# Patient Record
Sex: Male | Born: 1985 | Race: Asian | Hispanic: No | Marital: Single | Smoking: Former smoker
Health system: Southern US, Community
[De-identification: ages and names within clinical notes are randomized; demographics above are authoritative.]

---

## 2013-02-28 ENCOUNTER — Ambulatory Visit (INDEPENDENT_AMBULATORY_CARE_PROVIDER_SITE_OTHER): Payer: 59 | Admitting: Family Medicine

## 2013-02-28 VITALS — BP 120/80 | HR 73 | Temp 98.4°F | Resp 16 | Ht 71.5 in | Wt 179.0 lb

## 2013-02-28 DIAGNOSIS — N509 Disorder of male genital organs, unspecified: Secondary | ICD-10-CM

## 2013-02-28 DIAGNOSIS — R259 Unspecified abnormal involuntary movements: Secondary | ICD-10-CM

## 2013-02-28 DIAGNOSIS — N50811 Right testicular pain: Secondary | ICD-10-CM

## 2013-02-28 DIAGNOSIS — Z8739 Personal history of other diseases of the musculoskeletal system and connective tissue: Secondary | ICD-10-CM

## 2013-02-28 DIAGNOSIS — G252 Other specified forms of tremor: Secondary | ICD-10-CM

## 2013-02-28 DIAGNOSIS — Z0000 Encounter for general adult medical examination without abnormal findings: Secondary | ICD-10-CM

## 2013-02-28 LAB — POCT CBC
Granulocyte percent: 67.2 %G (ref 37–80)
HEMATOCRIT: 50.5 % (ref 43.5–53.7)
Hemoglobin: 16.4 g/dL (ref 14.1–18.1)
Lymph, poc: 2.1 (ref 0.6–3.4)
MCH, POC: 30.3 pg (ref 27–31.2)
MCHC: 32.5 g/dL (ref 31.8–35.4)
MCV: 93.2 fL (ref 80–97)
MID (cbc): 0.3 (ref 0–0.9)
MPV: 9.2 fL (ref 0–99.8)
POC Granulocyte: 4.9 (ref 2–6.9)
POC LYMPH PERCENT: 28.1 %L (ref 10–50)
POC MID %: 4.7 %M (ref 0–12)
Platelet Count, POC: 352 10*3/uL (ref 142–424)
RBC: 5.42 M/uL (ref 4.69–6.13)
RDW, POC: 13.3 %
WBC: 7.3 10*3/uL (ref 4.6–10.2)

## 2013-02-28 LAB — POCT UA - MICROSCOPIC ONLY
BACTERIA, U MICROSCOPIC: NEGATIVE
CASTS, UR, LPF, POC: NEGATIVE
Crystals, Ur, HPF, POC: NEGATIVE
Epithelial cells, urine per micros: NEGATIVE
MUCUS UA: NEGATIVE
RBC, urine, microscopic: NEGATIVE
WBC, Ur, HPF, POC: NEGATIVE
Yeast, UA: NEGATIVE

## 2013-02-28 LAB — POCT URINALYSIS DIPSTICK
Bilirubin, UA: NEGATIVE
Blood, UA: NEGATIVE
GLUCOSE UA: NEGATIVE
Ketones, UA: NEGATIVE
Leukocytes, UA: NEGATIVE
Nitrite, UA: NEGATIVE
Protein, UA: NEGATIVE
SPEC GRAV UA: 1.015
UROBILINOGEN UA: 0.2
pH, UA: 7.5

## 2013-02-28 NOTE — Patient Instructions (Signed)
I will let you know the results of the rest of your labs, including a thyroid test, in about one week.  Our referrals desk will contact you with regard to the referral to a urologist to check your testicle. If you do not hear from us within the next 10 days call back and speak to referrals to find out what is going on.  Return as needed

## 2013-02-28 NOTE — Progress Notes (Signed)
Annual physical examination   History: 28 year old male who is here for a physical examination. He has generally been in good health. Several years ago in [place] he had a painful scrotum and they told him there was a bubble in his sac,which was seen on the ultrasound. He was told this was not of major concern. He did have surgery on it but wouldn't be able to father children. He also had testing done about for 5 years ago when he was hardly able to move and told he was positive for ankylosing spondylitis. Otherwise been healthy young man. Recently has been having pain in his right side of the right testicle and is worried that he could have cancer or something. He also says that he has a fine tremor sometimes in his hands after playing video games for an hour. His father had hyperthyroidism tremor, and he is concerned about the possibility of that.  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 He is single. Has been in the [place] for 3 years afterstudying English 6 years in [place].he does work out some regularly.  Review of systems: Constitutional: Unremarkable Respiratory: Unremarkable Progress her: Unremarkable HEENT: Unremarkable GI: Used to have some heartburn but it's not a problem now GU: Testicular pain as above The skeletal: Unremarkable Dermatologic: Unremarkable Neurologic: Mild tremor as discussed above Psychiatric: Unremarkable  Physical examination: Well-developed well-nourished young man in no major distress. TMs normal. Eyes PERRLA. Fundi benign. Throat clear. Neck supple without nodes or thyromegaly. No  carotid bruits. Chest clear to auscultation. Heart regular without murmurs gallops or arrhythmias. Abdomen soft without mass or tenderness. Normal male external genitalia, circumcised, no testicular masses could be appreciated is a little tender on the lateral aspect of the right scrotum right testicle. Extremities unremarkable. Skin unremarkable. Neurological is essentially normal except for very minimal tremor only seen in a piece of paper is placed across his outstretched hand.  Assessment:  Annual physical exam Mild resting tremor Testicular pain History of ankylosing spondylitis  Plan: Check basic labs and chemistries. For him to a urologist for evaluation of the testicle. He probably needs an ultrasound done for peace of mind. I will let a urologist decide  . Results for orders placed in visit on 02/28/13  POCT CBC      Result Value Range   WBC 7.3  4.6 - 10.2 K/uL   Lymph, poc 2.1  0.6 - 3.4   POC LYMPH PERCENT 28.1  10 - 50 %L   MID (cbc) 0.3  0 - 0.9   POC MID % 4.7  0 - 12 %M   POC Granulocyte 4.9  2 - 6.9   Granulocyte percent 67.2  37 - 80 %G   RBC 5.42  4.69 - 6.13 M/uL   Hemoglobin 16.4  14.1 - 18.1 g/dL   HCT, POC 16.1  09.6 - 53.7 %   MCV 93.2  80 - 97 fL   MCH, POC 30.3  27 - 31.2 pg   MCHC 32.5  31.8 - 35.4 g/dL   RDW, POC 04.5     Platelet Count, POC 352  142 - 424 K/uL   MPV 9.2  0 - 99.8 fL  POCT UA - MICROSCOPIC ONLY      Result Value Range   WBC, Ur, HPF, POC neg     RBC, urine, microscopic neg     Bacteria, U Microscopic neg     Mucus, UA neg     Epithelial cells, urine per micros neg     Crystals, Ur, HPF, POC neg     Casts, Ur, LPF, POC neg     Yeast, UA neg    POCT URINALYSIS DIPSTICK      Result Value Range   Color, UA light yellow     Clarity, UA clear     Glucose, UA neg     Bilirubin, UA neg     Ketones, UA neg     Spec Grav, UA 1.015     Blood, UA neg     pH, UA 7.5     Protein, UA neg     Urobilinogen, UA 0.2     Nitrite, UA neg      Leukocytes, UA Negative

## 2013-03-01 LAB — LIPID PANEL
Cholesterol: 144 mg/dL (ref 0–200)
HDL: 40 mg/dL (ref >39)
LDL Cholesterol: 67 mg/dL (ref 0–99)
TRIGLYCERIDES: 187 mg/dL — AB (ref <150)
Total CHOL/HDL Ratio: 3.6 Ratio
VLDL: 37 mg/dL (ref 0–40)

## 2013-03-01 LAB — COMPREHENSIVE METABOLIC PANEL
ALBUMIN: 4.8 g/dL (ref 3.5–5.2)
ALK PHOS: 62 U/L (ref 39–117)
ALT: 14 U/L (ref 0–53)
AST: 14 U/L (ref 0–37)
BUN: 13 mg/dL (ref 6–23)
CALCIUM: 9.8 mg/dL (ref 8.4–10.5)
CO2: 27 mEq/L (ref 19–32)
Chloride: 103 mEq/L (ref 96–112)
Creat: 1.06 mg/dL (ref 0.50–1.35)
GLUCOSE: 82 mg/dL (ref 70–99)
POTASSIUM: 4.1 meq/L (ref 3.5–5.3)
Sodium: 140 mEq/L (ref 135–145)
Total Bilirubin: 1.4 mg/dL — ABNORMAL HIGH (ref 0.3–1.2)
Total Protein: 7.5 g/dL (ref 6.0–8.3)

## 2013-03-01 LAB — TSH: TSH: 1.159 u[IU]/mL (ref 0.350–4.500)

## 2013-03-02 ENCOUNTER — Encounter: Payer: Self-pay | Admitting: Radiology

## 2013-03-13 ENCOUNTER — Other Ambulatory Visit (HOSPITAL_COMMUNITY): Payer: Self-pay | Admitting: Urology

## 2013-03-13 DIAGNOSIS — N453 Epididymo-orchitis: Secondary | ICD-10-CM

## 2013-03-26 ENCOUNTER — Ambulatory Visit (HOSPITAL_COMMUNITY)
Admission: RE | Admit: 2013-03-26 | Discharge: 2013-03-26 | Disposition: A | Discharge status: Home / Self Care | Payer: 59 | Source: Ambulatory Visit | Attending: Urology | Admitting: Urology

## 2013-03-26 DIAGNOSIS — N453 Epididymo-orchitis: Secondary | ICD-10-CM

## 2013-03-26 DIAGNOSIS — I861 Scrotal varices: Secondary | ICD-10-CM | POA: Insufficient documentation

## 2013-06-11 ENCOUNTER — Emergency Department (INDEPENDENT_AMBULATORY_CARE_PROVIDER_SITE_OTHER)
Admission: EM | Admit: 2013-06-11 | Discharge: 2013-06-11 | Disposition: A | Discharge status: Home / Self Care | Payer: 59 | Source: Home / Self Care | Attending: Emergency Medicine | Admitting: Emergency Medicine

## 2013-06-11 ENCOUNTER — Encounter: Payer: Self-pay | Admitting: Emergency Medicine

## 2013-06-11 ENCOUNTER — Emergency Department (INDEPENDENT_AMBULATORY_CARE_PROVIDER_SITE_OTHER): Payer: 59

## 2013-06-11 DIAGNOSIS — R079 Chest pain, unspecified: Secondary | ICD-10-CM

## 2013-06-11 DIAGNOSIS — Z87891 Personal history of nicotine dependence: Secondary | ICD-10-CM

## 2013-06-11 NOTE — ED Provider Notes (Signed)
CSN: [id number]     Arrival date & time 06/11/13  1317 History   First MD Initiated Contact with Patient 06/11/13 1320     Chief Complaint  Patient presents with  . Chest Pain   (Consider location/radiation/quality/duration/timing/severity/associated sxs/prior Treatment) HPI Harry Stout is a 28 y.o. male who complains of 2 days ago experiencing sharp sudden chest pain.  It lasted for only got a second.  He was working out doing some planks and also some bench presses.  He states the pain was about 8/10 area and when he stopped doing the exercises, the pain went away and has stayed away.  He also states that he does have some similar pain when eating or drinking cold food in the past but this is not a new symptom.  No cough or upper respiratory symptoms.  He was told that a few years ago he had a pneumonia while visiting [place] and was given antibiotic for 7 days and then the residual symptoms.  He also has a history of ankylosing spondylitis and does have some occasional back and joint pain.  He does not follow primary-care physician and takes no medicines.  He smokes occasionally.   No shortness of breath, diaphoresis, wheezing, allergies, asthma.    History reviewed. No pertinent past medical history. History reviewed. No pertinent past surgical history. Family History  Problem Relation Age of Onset  . Hyperthyroidism Father    History  Substance Use Topics  . Smoking status: Former Games developer  . Smokeless tobacco: Not on file  . Alcohol Use: Yes    Review of Systems  All other systems reviewed and are negative.   Allergies  Review of patient's allergies indicates not on file.  Home Medications   Prior to Admission medications   Not on File   BP 121/72  Pulse 78  Temp(Src) 98 F (36.7 C) (Oral)  Ht 5\' 11"  (1.803 m)  Wt 183 lb (83.008 kg)  BMI 25.53 kg/m2  SpO2 97% Physical Exam  Nursing note and vitals reviewed. Constitutional: He is oriented to person, place, and time. He  appears well-developed and well-nourished.  HENT:  Head: Normocephalic and atraumatic.  Right Ear: Tympanic membrane, external ear and ear canal normal.  Left Ear: Tympanic membrane, external ear and ear canal normal.  Nose: Mucosal edema and rhinorrhea present.  Mouth/Throat: Posterior oropharyngeal erythema present. No oropharyngeal exudate or posterior oropharyngeal edema.  Eyes: No scleral icterus.  Neck: Neck supple.  Cardiovascular: Regular rhythm and normal heart sounds.   Pulmonary/Chest: Effort normal and breath sounds normal. No respiratory distress.  Neurological: He is alert and oriented to person, place, and time.  Skin: Skin is warm and dry.  Psychiatric: He has a normal mood and affect. His speech is normal.    ED Course  Procedures (including critical care time) Labs Review Labs Reviewed - No data to display  Results for orders placed in visit on 02/28/13  COMPREHENSIVE METABOLIC PANEL      Result Value Ref Range   Sodium 140  135 - 145 mEq/L   Potassium 4.1  3.5 - 5.3 mEq/L   Chloride 103  96 - 112 mEq/L   CO2 27  19 - 32 mEq/L   Glucose, Bld 82  70 - 99 mg/dL   BUN 13  6 - 23 mg/dL   Creat 4.09  8.11 - 9.14 mg/dL   Total Bilirubin 1.4 (*) 0.3 - 1.2 mg/dL   Alkaline Phosphatase 62  39 - 117 U/L  AST 14  0 - 37 U/L   ALT 14  0 - 53 U/L   Total Protein 7.5  6.0 - 8.3 g/dL   Albumin 4.8  3.5 - 5.2 g/dL   Calcium 9.8  8.4 - 46.910.5 mg/dL  TSH      Result Value Ref Range   TSH 1.159  0.350 - 4.500 uIU/mL  LIPID PANEL      Result Value Ref Range   Cholesterol 144  0 - 200 mg/dL   Triglycerides 629187 (*) <150 mg/dL   HDL 40  >52>39 mg/dL   Total CHOL/HDL Ratio 3.6     VLDL 37  0 - 40 mg/dL   LDL Cholesterol 67  0 - 99 mg/dL  POCT CBC      Result Value Ref Range   WBC 7.3  4.6 - 10.2 K/uL   Lymph, poc 2.1  0.6 - 3.4   POC LYMPH PERCENT 28.1  10 - 50 %L   MID (cbc) 0.3  0 - 0.9   POC MID % 4.7  0 - 12 %M   POC Granulocyte 4.9  2 - 6.9   Granulocyte percent  67.2  37 - 80 %G   RBC 5.42  4.69 - 6.13 M/uL   Hemoglobin 16.4  14.1 - 18.1 g/dL   HCT, POC 84.150.5  32.443.5 - 53.7 %   MCV 93.2  80 - 97 fL   MCH, POC 30.3  27 - 31.2 pg   MCHC 32.5  31.8 - 35.4 g/dL   RDW, POC 40.113.3     Platelet Count, POC 352  142 - 424 K/uL   MPV 9.2  0 - 99.8 fL  POCT UA - MICROSCOPIC ONLY      Result Value Ref Range   WBC, Ur, HPF, POC neg     RBC, urine, microscopic neg     Bacteria, U Microscopic neg     Mucus, UA neg     Epithelial cells, urine per micros neg     Crystals, Ur, HPF, POC neg     Casts, Ur, LPF, POC neg     Yeast, UA neg    POCT URINALYSIS DIPSTICK      Result Value Ref Range   Color, UA light yellow     Clarity, UA clear     Glucose, UA neg     Bilirubin, UA neg     Ketones, UA neg     Spec Grav, UA 1.015     Blood, UA neg     pH, UA 7.5     Protein, UA neg     Urobilinogen, UA 0.2     Nitrite, UA neg     Leukocytes, UA Negative     Imaging Review Dg Chest 2 View  06/11/2013   CLINICAL DATA:  Chest pain for 3 days.  Ex-smoker.  EXAM: CHEST  2 VIEW  COMPARISON:  None.  FINDINGS: Lateral view degraded by patient arm position. Midline trachea. Normal heart size and mediastinal contours. No pleural effusion or pneumothorax. Mild diffuse interstitial thickening, without lobar consolidation.  IMPRESSION: 1.  No acute cardiopulmonary disease. 2. Mild peribronchial thickening which may relate to chronic bronchitis or smoking.   Electronically Signed   By: Jeronimo GreavesKyle  Talbot M.D.   On: 06/11/2013 14:11     MDM   1. Chest pain     An x-ray was obtained and read by the radiologist as above.  Patient likely with costochondritis and I  explained the diagnosis to him.  Advised over-the-counter anti-inflammatory medicines, gentle stretching, heating pad.  I do not suspect any cardiac or intrathoracic pathology.  However ER precautions were given for worsening chest pain, short of breath, diaphoresis or any other new severe symptoms.  Patient understands and  agrees to this plan.  No followup required unless not improving.  Advised to quit smoking.  Marlaine HindJeffrey H Siedah Sedor, MD 06/11/13 [phone]

## 2013-06-11 NOTE — ED Notes (Signed)
Chest pain in middle of chest started 2 days ago while doing push ups, instant, sudden, sharp pain, 8/10. No pain when he stopped push ups. He also said he experiencing this pain when eating or drinking cold food or drinks.

## 2013-12-03 ENCOUNTER — Encounter (INDEPENDENT_AMBULATORY_CARE_PROVIDER_SITE_OTHER): Payer: Self-pay | Admitting: Urology

## 2013-12-03 ENCOUNTER — Ambulatory Visit (INDEPENDENT_AMBULATORY_CARE_PROVIDER_SITE_OTHER): Payer: No Typology Code available for payment source | Admitting: Urology

## 2013-12-03 VITALS — BP 125/76 | HR 65 | Ht 71.0 in | Wt 185.0 lb

## 2013-12-03 DIAGNOSIS — N50819 Testicular pain, unspecified: Secondary | ICD-10-CM

## 2013-12-03 DIAGNOSIS — N509 Disorder of male genital organs, unspecified: Secondary | ICD-10-CM

## 2013-12-03 LAB — POCT URINALYSIS DIPSTIX (10)(MULTI-TEST)
Bilirubin, UA POCT: NEGATIVE
Blood, UA POCT: NEGATIVE
Glucose, UA POCT: NEGATIVE mg/dL
Ketones, UA POCT: NEGATIVE mg/dL
Nitrite, UA POCT: NEGATIVE
POCT Leukocytes, UA: NEGATIVE
POCT Spec Gravity, UA: 1.03 (ref 1.001–1.035)
POCT pH, UA: 6 (ref 5–8)
Protein, UA POCT: NEGATIVE mg/dL
Urobilinogen, UA: 0.2 mg/dL

## 2013-12-03 MED ORDER — TRAMADOL-ACETAMINOPHEN 37.5-325 MG PO TABS
1.0000 | ORAL_TABLET | Freq: Four times a day (QID) | ORAL | Status: DC | PRN
Start: 2013-12-03 — End: 2017-06-13

## 2013-12-03 NOTE — Progress Notes (Signed)
Quick Note:    Please let patient know there are no concerning findings on ultrasound. Plan to keep scheduled follow up. Thanks.  ______

## 2013-12-03 NOTE — Patient Instructions (Signed)
1. Your urinalysis is negative for infection.  2. Your exam does not show concerning findings.  3. Please obtain a scrotal ultrasound prior to your next visit.  4. Supportive underwear at all times.  Avoid very tight pants or jeans.  5. Topical heat therapy twice daily.  6. Please take Aleve or Advil for 2 weeks as an anti-inflammatory.  7. You may take Ultracet as prescribed if the pain is severe.  8. Plan for follow up with me in 6 weeks

## 2013-12-03 NOTE — Progress Notes (Signed)
Subjective:      Patient ID: Barry Caldwell is a 28 y.o. male     Chief Complaint:    81 y/o M with a 2 year history of intermittent R testicular pain.  Barry Caldwell states that even a slight nudge to the testicle causes pain.  Barry Caldwell states that previously he would feel the pain about once every 2 months.  Pt now reports a constant, dull ache in the R testicle for the past 2 months.  No associated F/C/N/V, no dysuria, no hematuria.  No suprapubic or flank pain.  Barry Caldwell has been told he does have some degree of chronic prostatitis.  Pt has noted intermittent perineal pain with long foreplay, as well as maintaining erection if sexually active daily.  No h/o UTI's, no h/o stones, no pain with ejaculation or hematospermia, no GU trauma.    The following portions of the patient's history were reviewed and updated as appropriate: allergies, current medications, past family history, past medical history, past social history, past surgical history and problem list.    Review of Systems  Systems reviewed per the HPI and below:     History obtained from the patient     General ROS: Pt otherwise feeling well, no recent illness.       Ophthalmic ROS: negative for blurry vision or yellowing of the eyes     Allergy and Immunology ROS: known/unknown allergies as described by the patient     Hematological and Lymphatic ROS: No known bleeding/clotting disorders     Endocrine ROS: no significant hot/cold spells     Respiratory ROS: no cough, shortness of breath, or wheezing     Cardiovascular ROS: no chest pain or dyspnea on exertion     Gastrointestinal ROS: no abdominal pain, change in bowel habits     Genito-Urinary ROS: as per HPI, otherwise negative.     Musculoskeletal ROS:  no swelling to lower extremities, no back pain     Neurological ROS: no focal weakness     Dermatological ROS: no new rashes or lesions     Objective:   BP 125/76 mmHg  Pulse 65  Ht 1.803 m (5\' 11" )  Wt 83.915 kg (185 lb)  BMI 25.81 kg/m2   vital signs  reviewed    Physical Exam   Constitutional: Pt is oriented to person, place, and time and well-developed, well-nourished, and in no distress.   Head: Atraumatic.   Neck: Normal range of motion.   Cardiovascular: Normal rate.   Pulmonary/Chest: Effort normal.   Abdominal: Soft. Pt exhibits no distension and no mass. There is no tenderness. There is no rebound and no guarding.   GU: normal phallus, no penile or scrotal lesions, normal testes bilaterally, no masses, mildly tender R testis.  Musculoskeletal: Normal range of motion.   Neurological: Pt is alert and oriented to person, place, and time.   Skin: Skin is warm and dry.   Psychiatric: Mood, memory, affect and judgment normal.       Lab Review   Urine analysis shows Negative    Radiology Review   None    Assessment:     1. Orchalgia           Plan:   Patient Instructions   1. Your urinalysis is negative for infection.  2. Your exam does not show concerning findings.  3. Please obtain a scrotal ultrasound prior to your next visit.  4. Supportive underwear at all times.  Avoid very tight  pants or jeans.  5. Topical heat therapy twice daily.  6. Please take Aleve or Advil for 2 weeks as an anti-inflammatory.  7. You may take Ultracet as prescribed if the pain is severe.  8. Plan for follow up with me in 6 weeks         Orders  No orders of the defined types were placed in this encounter.

## 2014-01-14 ENCOUNTER — Ambulatory Visit (INDEPENDENT_AMBULATORY_CARE_PROVIDER_SITE_OTHER): Payer: No Typology Code available for payment source | Admitting: Urology

## 2014-01-29 ENCOUNTER — Ambulatory Visit (INDEPENDENT_AMBULATORY_CARE_PROVIDER_SITE_OTHER): Payer: No Typology Code available for payment source | Admitting: Urology

## 2015-06-05 IMAGING — CR DG CHEST 2V
2 series · 2 of 2 positions shown · non-contrast
Comparison: None.

CLINICAL DATA: Chest pain for 3 days.  Ex-smoker.

EXAM:
CHEST  2 VIEW

[view not recorded (1 of 2)]
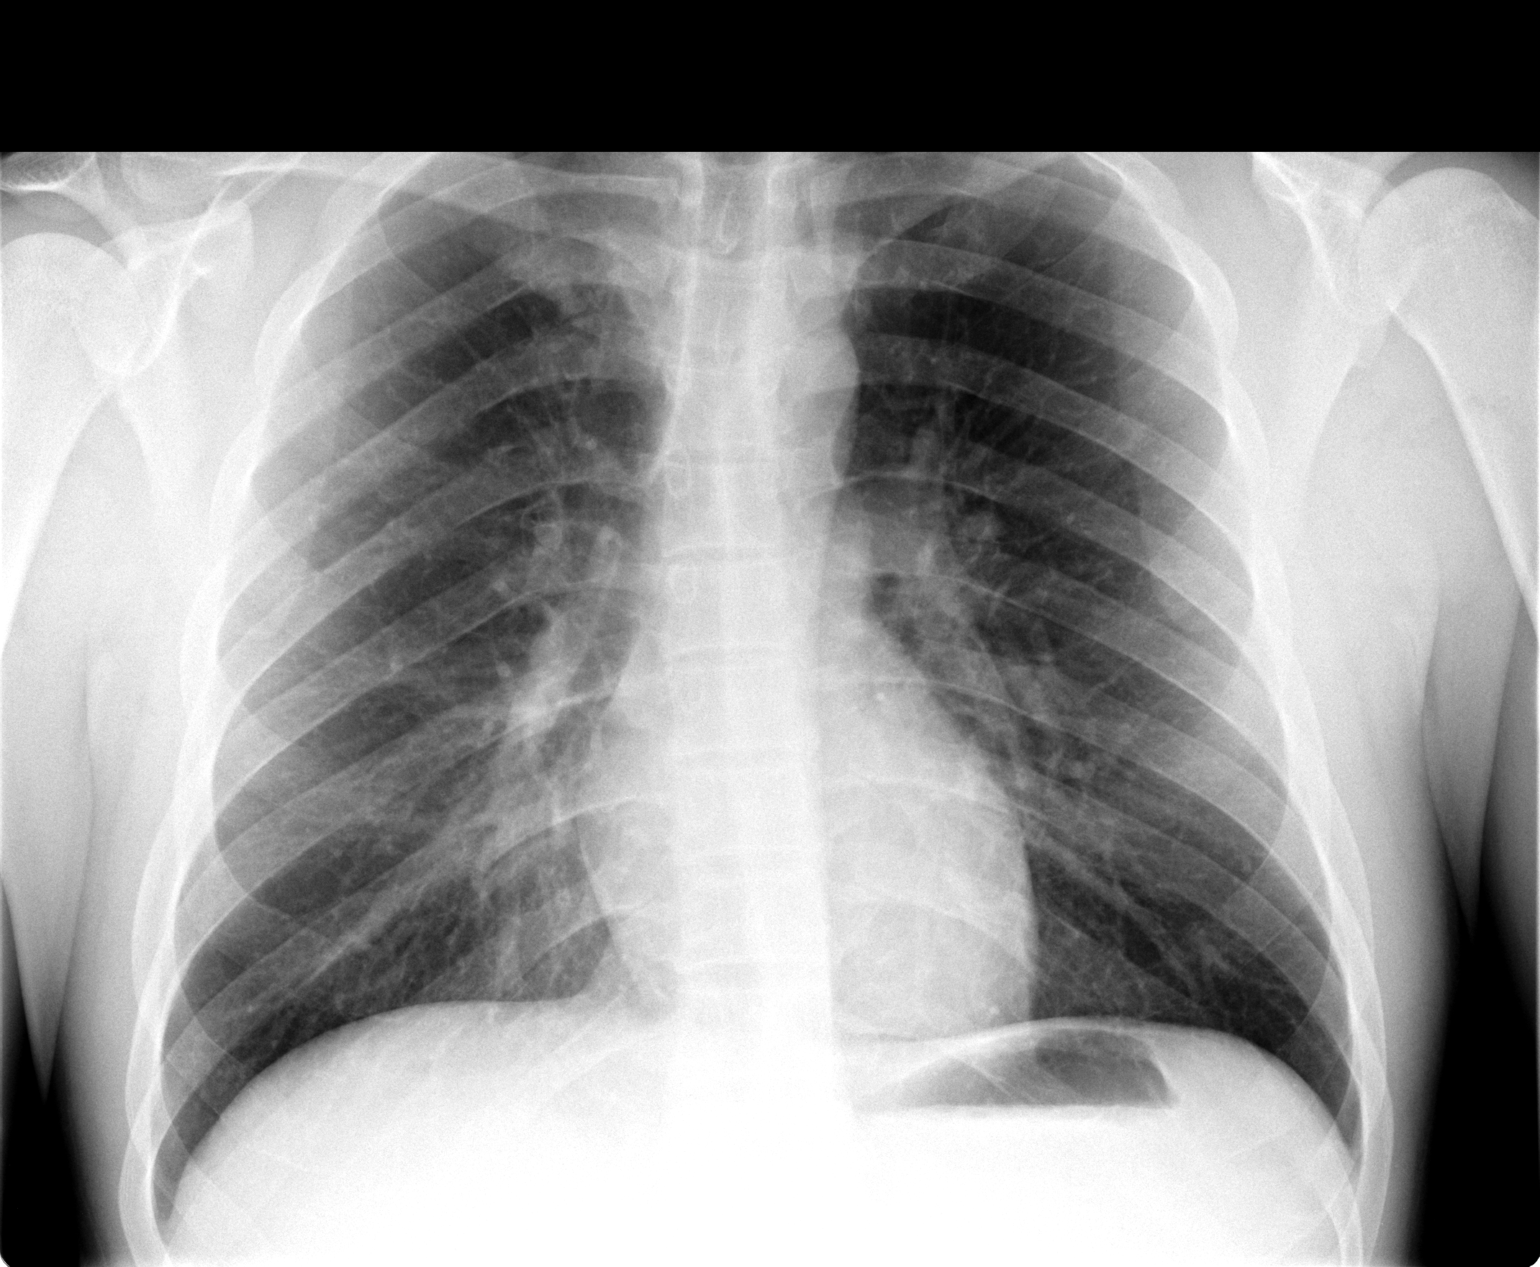

[view not recorded (2 of 2)]
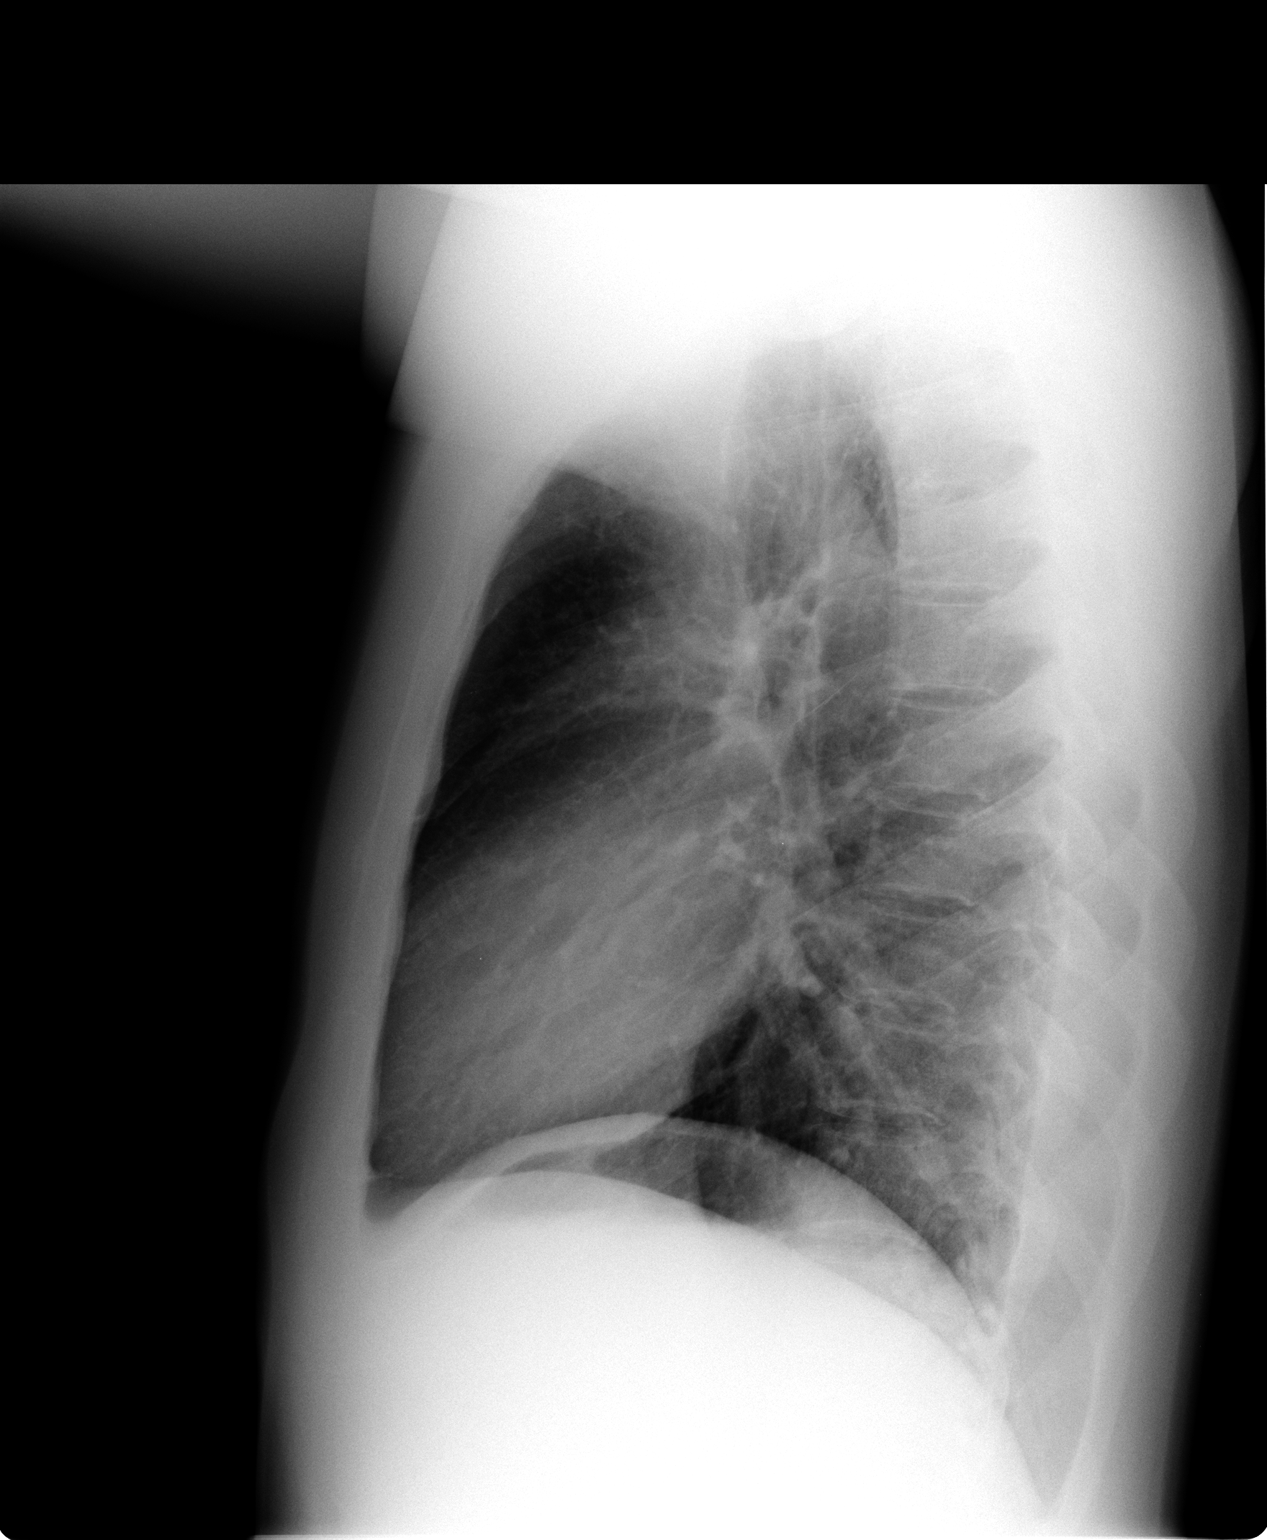

[2 of 2 positions shown; findings below may reference images not displayed]

FINDINGS: Lateral view degraded by patient arm position. Midline trachea.
Normal heart size and mediastinal contours. No pleural effusion or
pneumothorax. Mild diffuse interstitial thickening, without lobar
consolidation.
IMPRESSION: 1.  No acute cardiopulmonary disease.
2. Mild peribronchial thickening which may relate to chronic
bronchitis or smoking.
